# Patient Record
Sex: Female | Born: 1970 | Race: White | Hispanic: No | Marital: Single
Health system: Southern US, Community
[De-identification: ages and names within clinical notes are randomized; demographics above are authoritative.]

---

## 2004-04-10 ENCOUNTER — Emergency Department: Payer: Self-pay | Admitting: Internal Medicine

## 2004-12-29 ENCOUNTER — Other Ambulatory Visit: Payer: Self-pay

## 2004-12-29 ENCOUNTER — Emergency Department: Payer: Self-pay | Admitting: Internal Medicine

## 2005-05-04 ENCOUNTER — Emergency Department: Payer: Self-pay | Admitting: Unknown Physician Specialty

## 2005-10-23 ENCOUNTER — Other Ambulatory Visit: Payer: Self-pay

## 2005-10-23 ENCOUNTER — Emergency Department: Payer: Self-pay | Admitting: Emergency Medicine

## 2005-12-15 ENCOUNTER — Emergency Department: Payer: Self-pay | Admitting: General Practice

## 2006-01-24 ENCOUNTER — Emergency Department: Payer: Self-pay | Admitting: Emergency Medicine

## 2006-02-18 ENCOUNTER — Emergency Department: Payer: Self-pay | Admitting: Emergency Medicine

## 2006-05-23 ENCOUNTER — Emergency Department: Payer: Self-pay | Admitting: Emergency Medicine

## 2006-06-22 ENCOUNTER — Emergency Department: Payer: Self-pay | Admitting: General Practice

## 2008-02-18 ENCOUNTER — Emergency Department: Payer: Self-pay | Admitting: Emergency Medicine

## 2008-05-05 ENCOUNTER — Emergency Department: Payer: Self-pay | Admitting: Emergency Medicine

## 2010-02-22 ENCOUNTER — Emergency Department: Payer: Self-pay | Admitting: Emergency Medicine

## 2010-12-23 ENCOUNTER — Emergency Department: Payer: Self-pay | Admitting: Emergency Medicine

## 2011-03-27 ENCOUNTER — Ambulatory Visit: Payer: Self-pay

## 2011-03-28 ENCOUNTER — Ambulatory Visit: Payer: Self-pay | Admitting: Urology

## 2011-04-05 ENCOUNTER — Ambulatory Visit: Payer: Self-pay | Admitting: Family Medicine

## 2012-05-06 ENCOUNTER — Ambulatory Visit: Payer: Self-pay | Admitting: Internal Medicine

## 2012-08-18 ENCOUNTER — Emergency Department: Payer: Self-pay | Admitting: Emergency Medicine

## 2012-08-18 LAB — COMPREHENSIVE METABOLIC PANEL
Anion Gap: 10 (ref 7–16)
BUN: 11 mg/dL (ref 7–18)
Bilirubin,Total: 0.2 mg/dL (ref 0.2–1.0)
Chloride: 102 mmol/L (ref 98–107)
EGFR (African American): >60
EGFR (Non-African Amer.): >60
Glucose: 89 mg/dL (ref 65–99)
Osmolality: 271 (ref 275–301)
Potassium: 3.1 mmol/L — ABNORMAL LOW (ref 3.5–5.1)
SGOT(AST): 14 U/L — ABNORMAL LOW (ref 15–37)
SGPT (ALT): 16 U/L (ref 12–78)
Sodium: 136 mmol/L (ref 136–145)

## 2012-08-18 LAB — CBC WITH DIFFERENTIAL/PLATELET
Comment - H1-Com2: NORMAL
Eosinophil: 21 %
HGB: 13.6 g/dL (ref 12.0–16.0)
Lymphocytes: 23 %
MCHC: 33.1 g/dL (ref 32.0–36.0)
MCV: 84 fL (ref 80–100)
RBC: 4.92 10*6/uL (ref 3.80–5.20)
Segmented Neutrophils: 42 %
Variant Lymphocyte - H1-Rlymph: 10 %
WBC: 14.4 10*3/uL — ABNORMAL HIGH (ref 3.6–11.0)

## 2012-08-26 ENCOUNTER — Ambulatory Visit: Payer: Self-pay

## 2012-08-26 LAB — RAPID STREP-A WITH REFLX: Micro Text Report: NEGATIVE

## 2012-08-28 LAB — BETA STREP CULTURE(ARMC)

## 2012-11-09 ENCOUNTER — Ambulatory Visit: Payer: Self-pay

## 2012-11-09 ENCOUNTER — Inpatient Hospital Stay: Payer: Self-pay | Admitting: Internal Medicine

## 2012-11-09 LAB — URINALYSIS, COMPLETE
Bilirubin,UR: NEGATIVE
Blood: NEGATIVE
Glucose,UR: NEGATIVE mg/dL (ref 0–75)
Protein: NEGATIVE
Specific Gravity: 1.01 (ref 1.003–1.030)
WBC UR: <1 /HPF (ref 0–5)

## 2012-11-09 LAB — CBC WITH DIFFERENTIAL/PLATELET
Basophil %: 0.4 %
Eosinophil #: 0.3 10*3/uL (ref 0.0–0.7)
Eosinophil %: 3.7 %
HCT: 37.6 % (ref 35.0–47.0)
HGB: 12.5 g/dL (ref 12.0–16.0)
MCH: 27.6 pg (ref 26.0–34.0)
MCHC: 33.3 g/dL (ref 32.0–36.0)
MCV: 83 fL (ref 80–100)
Monocyte #: 0.2 x10 3/mm (ref 0.2–0.9)
Monocyte %: 1.9 %
WBC: 8.6 10*3/uL (ref 3.6–11.0)

## 2012-11-09 LAB — COMPREHENSIVE METABOLIC PANEL
Alkaline Phosphatase: 86 U/L (ref 50–136)
Anion Gap: 11 (ref 7–16)
BUN: 6 mg/dL — ABNORMAL LOW (ref 7–18)
Chloride: 101 mmol/L (ref 98–107)
Co2: 29 mmol/L (ref 21–32)
Creatinine: 0.89 mg/dL (ref 0.60–1.30)
EGFR (African American): >60
EGFR (Non-African Amer.): >60
Glucose: 107 mg/dL — ABNORMAL HIGH (ref 65–99)
Potassium: 3.2 mmol/L — ABNORMAL LOW (ref 3.5–5.1)
SGOT(AST): 17 U/L (ref 15–37)
Sodium: 141 mmol/L (ref 136–145)
Total Protein: 6.6 g/dL (ref 6.4–8.2)

## 2012-11-09 LAB — LIPASE, BLOOD: Lipase: 56 U/L — ABNORMAL LOW (ref 73–393)

## 2012-11-09 LAB — DRUG SCREEN, URINE
MDMA (Ecstasy)Ur Screen: NEGATIVE (ref ?–500)
Opiate, Ur Screen: POSITIVE (ref ?–300)
Phencyclidine (PCP) Ur S: NEGATIVE (ref ?–25)
Tricyclic, Ur Screen: NEGATIVE (ref ?–1000)

## 2012-11-10 LAB — CBC WITH DIFFERENTIAL/PLATELET
Eosinophil #: 0.5 10*3/uL (ref 0.0–0.7)
Eosinophil %: 3.2 %
HGB: 10.9 g/dL — ABNORMAL LOW (ref 12.0–16.0)
Lymphocyte #: 1 10*3/uL (ref 1.0–3.6)
Lymphocyte %: 6.6 %
MCHC: 33.8 g/dL (ref 32.0–36.0)
MCV: 83 fL (ref 80–100)
Monocyte #: 0.4 x10 3/mm (ref 0.2–0.9)
Monocyte %: 2.4 %
Neutrophil #: 13.5 10*3/uL — ABNORMAL HIGH (ref 1.4–6.5)
Neutrophil %: 87.5 %
Platelet: 204 10*3/uL (ref 150–440)
WBC: 15.4 10*3/uL — ABNORMAL HIGH (ref 3.6–11.0)

## 2012-11-10 LAB — BASIC METABOLIC PANEL
Anion Gap: 5 — ABNORMAL LOW (ref 7–16)
BUN: 9 mg/dL (ref 7–18)
Calcium, Total: 7.8 mg/dL — ABNORMAL LOW (ref 8.5–10.1)
Chloride: 110 mmol/L — ABNORMAL HIGH (ref 98–107)
Co2: 27 mmol/L (ref 21–32)
EGFR (Non-African Amer.): >60
Glucose: 99 mg/dL (ref 65–99)

## 2012-11-10 LAB — MAGNESIUM: Magnesium: 1.1 mg/dL — ABNORMAL LOW

## 2012-11-11 LAB — CBC WITH DIFFERENTIAL/PLATELET
Basophil #: 0 10*3/uL (ref 0.0–0.1)
Basophil %: 0.3 %
Eosinophil %: 6 %
Lymphocyte #: 1.2 10*3/uL (ref 1.0–3.6)
Lymphocyte %: 8.7 %
MCHC: 33.3 g/dL (ref 32.0–36.0)
MCV: 83 fL (ref 80–100)
Monocyte %: 3.6 %
Platelet: 201 10*3/uL (ref 150–440)
RBC: 3.85 10*6/uL (ref 3.80–5.20)
WBC: 13.8 10*3/uL — ABNORMAL HIGH (ref 3.6–11.0)

## 2012-11-11 LAB — MAGNESIUM: Magnesium: 1.4 mg/dL — ABNORMAL LOW

## 2013-01-07 ENCOUNTER — Ambulatory Visit: Payer: Self-pay | Admitting: Family Medicine

## 2014-10-29 NOTE — H&P (Signed)
PATIENT NAME:  Ashlee [name], Ashlee [name] MR#:  [id number] DATE OF BIRTH:  1971/06/14  DATE OF ADMISSION:  11/09/2012  PRIMARY CARE PHYSICIAN: At [facility].  Steffanie RainwaterFiancee does not know the name.  The person who is following up her trigeminal neuralgia is Dr. Clint [facility]sLiedtke at [facility].   CHIEF COMPLAINT: Brought in secondary to lethargy.   HISTORY OF PRESENT ILLNESS: This is a 44 year old female with a history of trigeminal nerve problem and chronic pain. It started last Monday of having uncontrolled bowel movements, a few per day.  Friday evening, she developed some chest pain and cough, Saturday night, developed a fever, and today is very lethargic, unable to get out of bed. In the ER, she was found to be hypotensive with fever, tachycardic. They took her fentanyl patch off and gave a fluid bolus, and they started a second bolus. Over the past week, the patient has not been eating very well. She ran out of her Cymbalta, as per the fiancee 4 to 5 months ago, had a similar thing of uncontrolled bowel movements and was given antibiotics and got better. Hospitalist services were contacted for further evaluation for shock.   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   PAST SURGICAL HISTORY: Had a brain surgery on the trigeminal nerve on the left, hysterectomy.   ALLERGIES: PENICILLIN, CLINDAMYCIN AND SULFA.   SOCIAL HISTORY: No smoking. Rare alcohol. No drug use. She has been out of work for a while, but now she is working in [employer]. She used to work at [employer].   FAMILY HISTORY: Both parents had heart disease in their 3440s, both still living, though.   MEDICATIONS: As per Prescription Writer include clonazepam orally unknown dose, Cymbalta orally unknown dose, fentanyl patch 75 mcg/h transdermal film extended-release every 72 hours.   REVIEW OF SYSTEMS: Unable to obtain secondary to lethargy.  PHYSICAL EXAMINATION: VITAL SIGNS: On presentation  to the Emergency Room included a temperature of 103, pulse 120, respirations 22, blood pressure 106/61 pulse oximetry 94% on room air. The most recent vital signs are temperature 99, pulse 80, respirations 13, blood pressure 86/51, pulse ox 97% on 2 liters.  GENERAL: Decreased respirations and lethargic, open eyes to sternal rub.  HEENT: Eyes: Conjunctivae and lids normal. Pupils equal, round, and reactive to light. Unable to test extraocular muscles. Ears, nose, mouth, and throat: Tympanic membranes no erythema. Nasal mucosa no erythema. Throat no erythema. No exudates seen. Lips and gums no lesions.  NECK: No JVD. No bruits. No lymphadenopathy. No thyromegaly. No thyroid nodules palpated.  RESPIRATORY: Decreased respirations. Poor inspiratory effort.  Rhonchi in bilateral bases.  CARDIOVASCULAR: S1, S2, tachycardic. No gallops. No rubs. A 2/6 systolic ejection murmur. Carotid upstroke 2+ bilaterally. No bruits. Dorsalis pedis pulses 2+ bilaterally. Trace edema of the lower extremity.  ABDOMEN: Soft, grimaces when I palpate the lower abdomen. No organomegaly or splenomegaly. Normoactive bowel sounds.  LYMPHATIC: No lymph nodes in the neck.  MUSCULOSKELETAL: No clubbing. Trace edema. No cyanosis.  SKIN: No rashes or ulcers anteriorly.  NEUROLOGIC: The patient is lethargic,  difficult to test.  She is arousable with sternal rub and answers the question and falls right back to sleep.  PSYCHIATRIC: Difficult to test secondary to altered mental status.   LABORATORY AND RADIOLOGICAL DATA:  Urinalysis negative. Urine toxicology positive for opiates. Positive for benzodiazepines. White blood cell count 8.6, hemoglobin and hematocrit 12.5 and 37.6, platelet count 186.  Glucose 107, BUN 6, creatinine 0.89, sodium 141, potassium 3.2, chloride 101, CO2 29, calcium 8.5. Liver function tests normal range. Amylase 14, lipase 56.   CT scan of the abdomen and pelvis with IV contrast shows prominent nonspecific  abdominal lymph nodes, could indicate mesenteric adenitis, nonspecific splenomegaly. Some fluid within the pelvis, some gallbladder sludge, patchy airspace disease noted within the lung suggestive of pneumonia.   ASSESSMENT AND PLAN: 1.  Severe sepsis with septic shock:  We will give IV fluid boluses and IV fluid maintenance. We will start Levophed if blood pressure remains under 100. Blood cultures x 2 sent off. Urine culture ordered. The patient did have a fever of 103 when she came in and tachycardia of 120. The patient has encephalopathy, increased respirations on presentation and now decreased respirations here. The patient is critically ill and will be admitted to the CCU.  2.  Encephalopathy and decreased respirations:  Could be septic shock-related versus pain medication-related. I am always hesitant on giving Narcan to the patients on chronic pain medications, but I think a small dose of 0.4 mg IV x 1 will be warranted in her case, and she did have increased respirations initially and then decreased respirations now.  I would rather avoid intubation and do a trial of the Narcan.  I will DC the fentanyl patch.  3.  Mesenteric adenitis:  Levaquin and Flagyl is ordered.  4.  Pneumonia seen on CT scan: Since the patient will be admitted to the CCU, I will get more aggressive with antibiotics, Levaquin.  Aztreonam will be given since the patient does have a penicillin allergy with swelling of the throat.  The aztreonam will be given as an alternative to the beta lactams.  Blood cultures drawn prior to antibiotics given.  5.  Trigeminal neuralgia and chronic pain:  With her altered mental status,  I am unable to give any medications at this time.  6.  Depression:  She ran out of her Cymbalta last week.  With her altered mental status, unable to give at this time.  7.  Hypokalemia: We will replace in IV fluids.   TIME SPENT ON ADMISSION: 75 minutes critical care.  ____________________________ Herschell Dimes. Renae Gloss, MD rjw:cb [name]: 11/09/2012 20:48:36 ET T: 11/09/2012 21:28:51 ET JOB#: [id number]  cc: Herschell Dimes. Renae Gloss, MD, <Dictator> Dr. Clint Lipps, [employer] Salley Scarlet MD ELECTRONICALLY SIGNED 11/10/2012 21:33

## 2014-10-29 NOTE — Discharge Summary (Signed)
PATIENT NAME:  Ashlee CutterFLYNN, Littie D MR#:  [id number] DATE OF BIRTH:  1971/02/09  DATE OF ADMISSION:  11/09/2012 DATE OF DISCHARGE:  11/11/2012  PRIMARY CARE PHYSICIAN: Rolm GalaHeidi Grandis, MD  DISCHARGE DIAGNOSES:  1.  Severe sepsis with septic shock. 2.  Metabolic encephalopathy possibly due to combination of sepsis and medication overdose.  3.  Pneumonia.  4.  Mesenteric adenitis.   CONDITION: Stable.   CODE STATUS: FULL CODE.   HOME MEDICATIONS: 1.  Clonazepam p.o. daily. 2.  Fentanyl patch 50 mcg transdermal film 1 patch transdermal every 72 hours. 3.  Levaquin 75 mg p.o. daily.  DIET: Regular diet.   ACTIVITY: As tolerated.   FOLLOW-UP CARE:  With PCP within 1 week.  REASON FOR ADMISSION: Lethargy.   HOSPITAL COURSE:  1.  The patient is a 44 year old Caucasian female with a history of trigeminal nerve problems and chronic pain who developed some chest pain and cough with uncontrolled bowel movement and fever.  The patient was very lethargic, unable to get out of bed.  In the ED, the patient was noted to have hypotension with fever and tachycardia. The patient was on fentanyl patch. Her home dose was 50 mcg but was increased to 70 mcg recently. Fentanyl patch was taken off.  The patient was treated with fluid bolus. For detailed history and physical examination, please refer to the admission note dictated by Dr. Renae GlossWieting on admission date.  The patient's urinalysis was negative. Urine toxicology was positive for opiates and benzodiazepine. The patient's white count was 8.6, hemoglobin 12.5, BUN 6 and creatinine 0.89. CAT scan of the abdomen and pelvis with contrast showed prominent nonspecific abdominal lymph nodes, indicative of mesenteric adenitis and nonspecific splenomegaly. The patient was admitted to stepdown unit for severe sepsis with septic shock. The patient was treated with IV fluid bolus and maintenance. Blood cultures twice were negative The patient has been treated with Levaquin,  Azactam, and Flagyl. The patient's symptoms have much improved. No bowel movement after admission. The patient's blood pressure has been stable around 100 to 117. According to the patient, the patient's blood pressure is about 90 to 100 at her baseline.  2.  Encephalopathy, which is possibly due to sepsis, septic shock versus pain medication related. As mentioned above, the patient was on a Fentanyl patch at 75 mcg, which was discontinued.  Encephalopathy improved after admission,  3.  Pneumonia seen on CT. The patient has no cough or sputum, but CAT scan showed pneumonia for which the patient has been treated with antibiotics. 4.  The patient has leukocytosis.  WBC was 15.4 and decreased to 13.9 today.  The patient wants to go home today. She denies any symptoms.  Since the patient symptoms have been improving and the patient is clinically stable, will be discharged to home with antibiotics today. I discussed the patient's discharge plan with the patient and the case manager.   TIME SPENT: About 37 minutes.  ____________________________ Shaune PollackQing Eryn Marandola, MD qc:sb D: 11/11/2012 14:30:35 ET T: 11/11/2012 15:02:24 ET JOB#: [id number]  cc: Shaune PollackQing Marjani Kobel, MD, <Dictator> Shaune PollackQING Lani Havlik MD ELECTRONICALLY SIGNED 11/11/2012 16:26

## 2015-03-13 IMAGING — CT CT STONE STUDY
1 of 2 series · 15 of 32 positions shown, 19 images · non-contrast
Comparison: none

REASON FOR EXAM: Call report  [id number]   back pain  hist stones
COMMENTS:

PROCEDURE:     BLONDINACKA - BLONDINACKA ABDOMEN/PELVIS WO ( STONE)  - January 07, 2013  [DATE]
RESULT:     Comparison: 11/09/2012
TECHNIQUE: Multiple axial images from the lung bases to the symphysis pubis
were obtained without oral and without intravenous contrast.

[Series 2: soft tissue · axial · 0.67mm/px · z∈[-489,-60]mm · 15 of 157 slices shown, 19 images]
[im 7/157  soft-tissue]
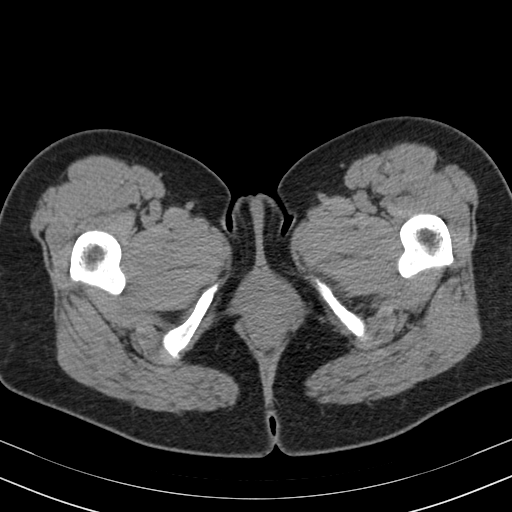
[im 7/157  bone]
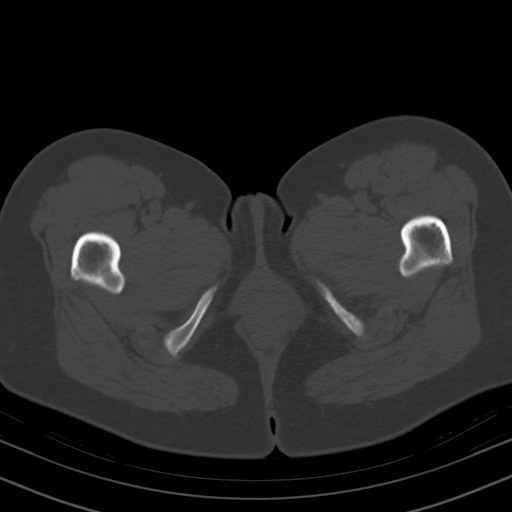
[im 19/157  soft-tissue]
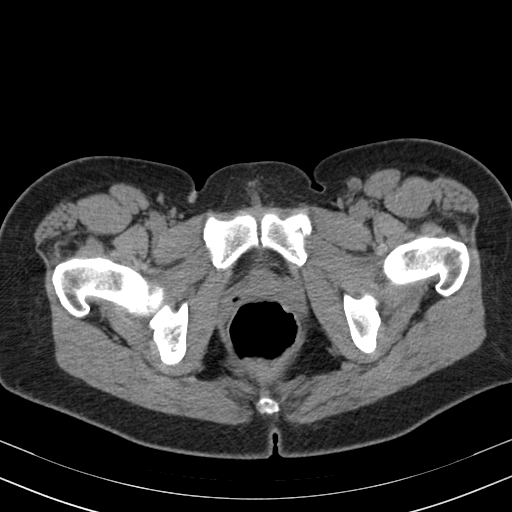
[im 32/157  soft-tissue]
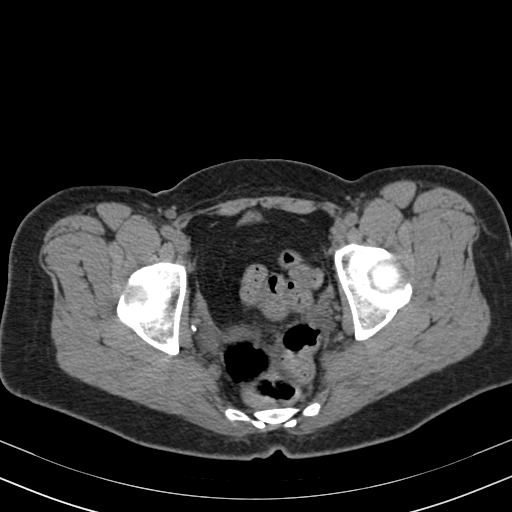
[im 44/157  soft-tissue]
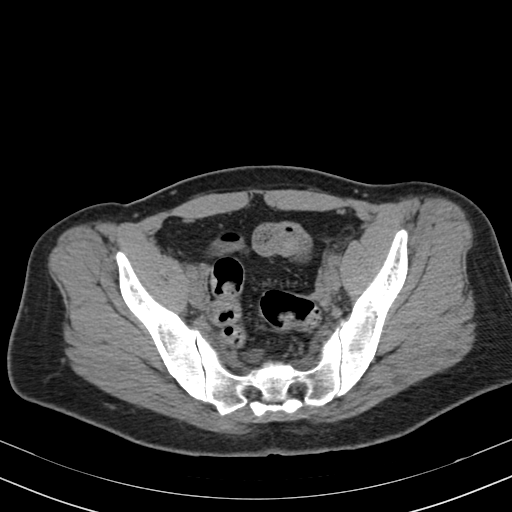
[im 57/157  soft-tissue]
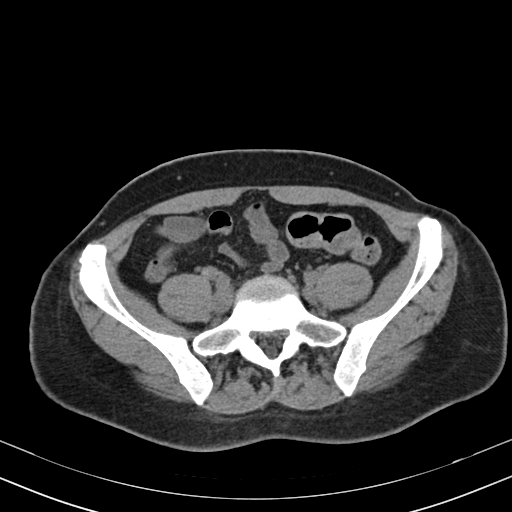
[im 69/157  soft-tissue]
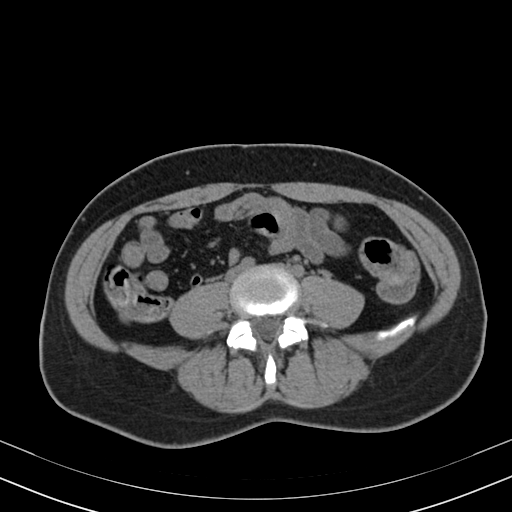
[im 82/157  soft-tissue]
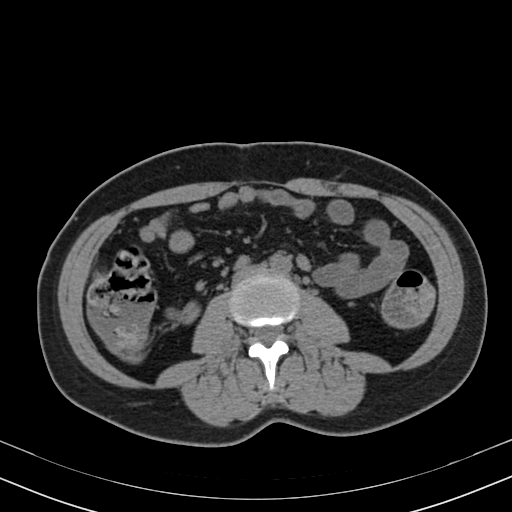
[im 88/157  soft-tissue]
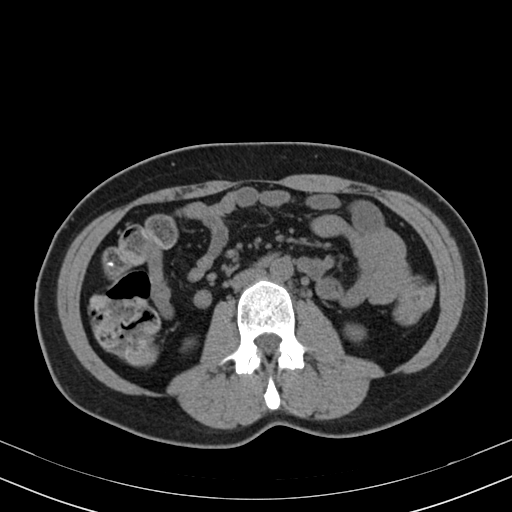
[im 100/157  soft-tissue]
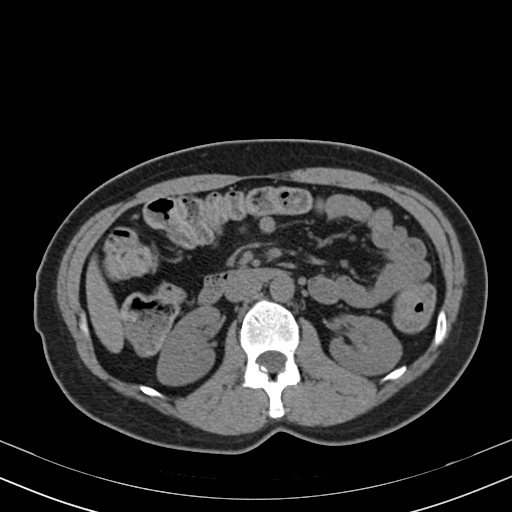
[im 100/157  bone]
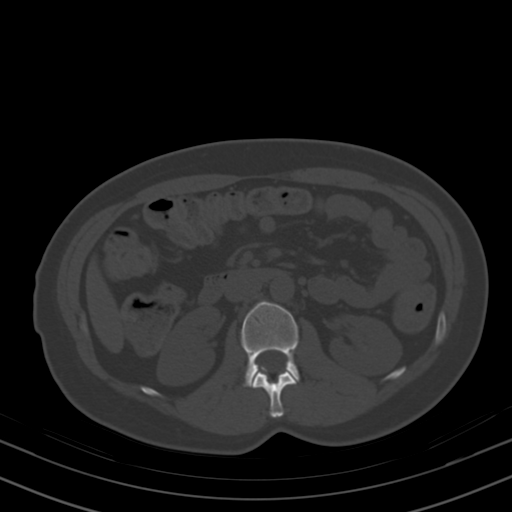
[im 113/157  soft-tissue]
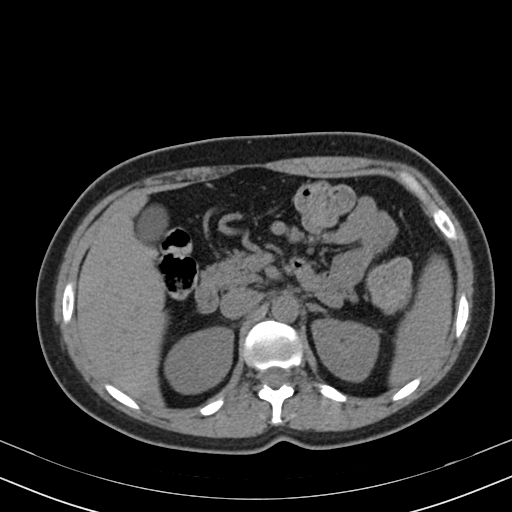
[im 125/157  soft-tissue]
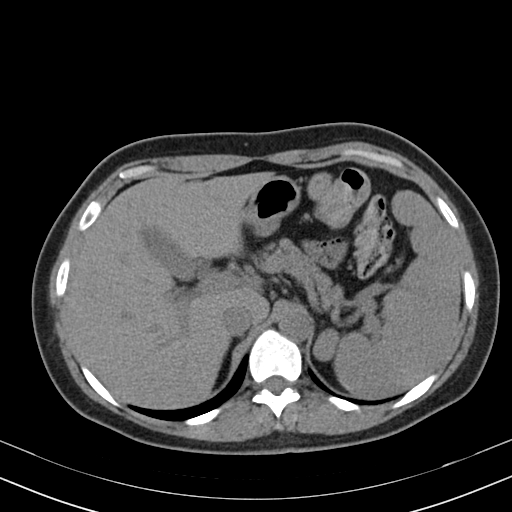
[im 132/157  lung]
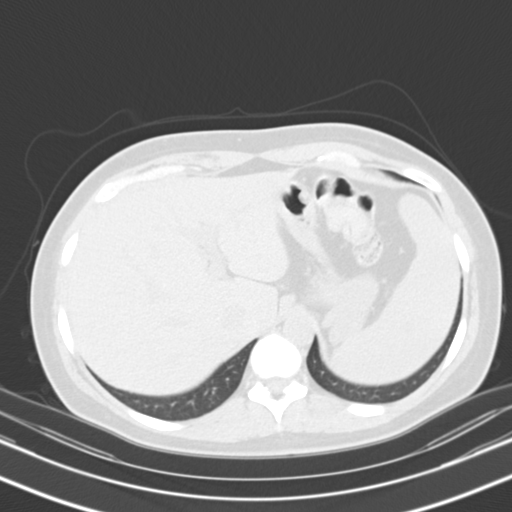
[im 138/157  soft-tissue]
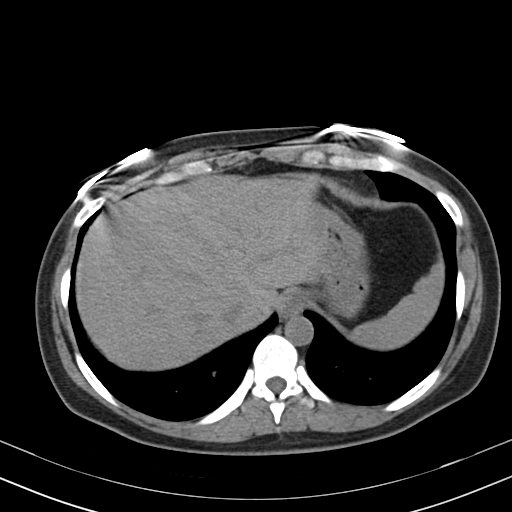
[im 138/157  lung]
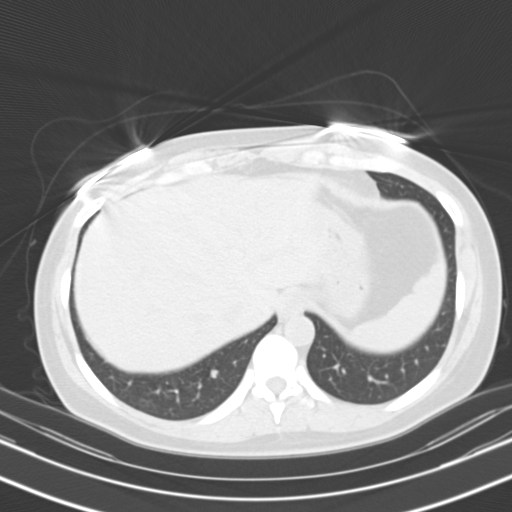
[im 144/157  lung]
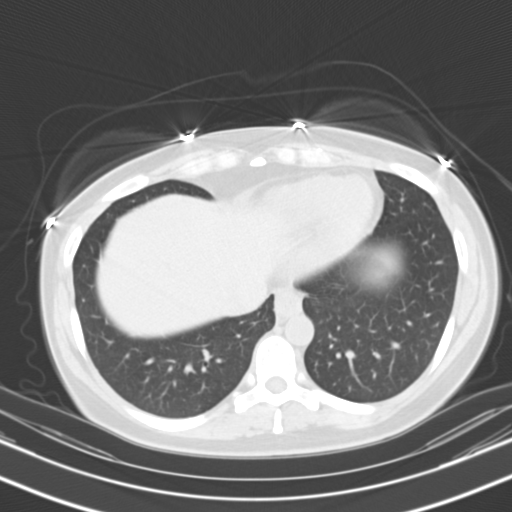
[im 150/157  soft-tissue]
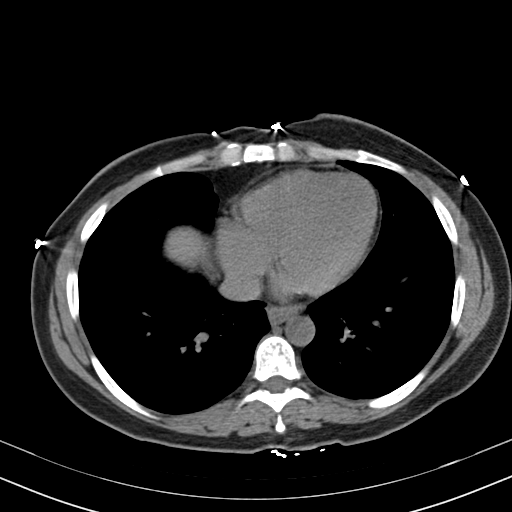
[im 150/157  lung]
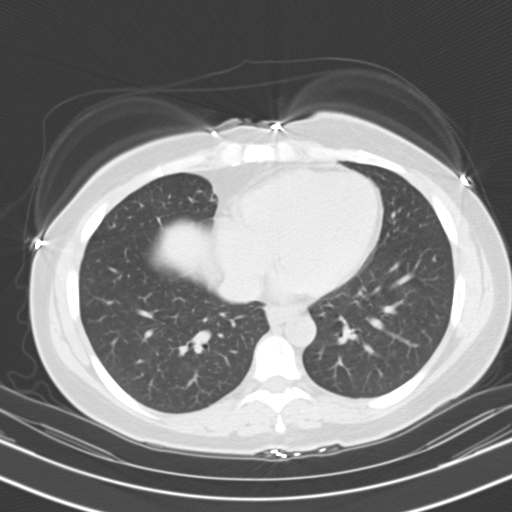

[15 of 32 positions shown; findings below may reference images not displayed]

FINDINGS: Lack of intravenous contrast limits evaluation of the solid abdominal
organs.  Grossly, the liver, the adrenals, and pancreas are unremarkable.
Small calcified stones are seen in the gallbladder. Several small nodules
adjacent to the spleen likely represent splenules.

No renal calculi or hydronephrosis. No ureterectasis.

The small and large bowel are normal in caliber. There is a trace amount of
free fluid in the pelvis, which is likely physiologic. The patient is status
post hysterectomy. The appendix is normal.

No aggressive lytic or sclerotic osseous lesions are identified.
IMPRESSION: 1. No renal calculi or hydronephrosis.
2. Cholelithiasis.

## 2017-02-06 DEATH — deceased
